# Patient Record
Sex: Female | Born: 1990 | Race: Black or African American | Hispanic: No | Marital: Single | Smoking: Former smoker
Health system: Southern US, Community
[De-identification: ages and names within clinical notes are randomized; demographics above are authoritative.]

## PROBLEM LIST (undated history)

## (undated) DIAGNOSIS — J45909 Unspecified asthma, uncomplicated: Secondary | ICD-10-CM

## (undated) DIAGNOSIS — T7840XA Allergy, unspecified, initial encounter: Secondary | ICD-10-CM

## (undated) HISTORY — DX: Allergy, unspecified, initial encounter: T78.40XA

## (undated) HISTORY — DX: Unspecified asthma, uncomplicated: J45.909

---

## 2013-04-15 ENCOUNTER — Ambulatory Visit (INDEPENDENT_AMBULATORY_CARE_PROVIDER_SITE_OTHER): Payer: BC Managed Care – PPO | Admitting: Physician Assistant

## 2013-04-15 VITALS — BP 104/60 | HR 82 | Temp 98.1°F | Resp 16 | Ht 66.75 in | Wt 146.2 lb

## 2013-04-15 DIAGNOSIS — J029 Acute pharyngitis, unspecified: Secondary | ICD-10-CM

## 2013-04-15 DIAGNOSIS — H9209 Otalgia, unspecified ear: Secondary | ICD-10-CM

## 2013-04-15 DIAGNOSIS — H9203 Otalgia, bilateral: Secondary | ICD-10-CM

## 2013-04-15 LAB — POCT RAPID STREP A (OFFICE): Rapid Strep A Screen: NEGATIVE

## 2013-04-15 MED ORDER — AMOXICILLIN 875 MG PO TABS: 875.0000 mg | ORAL_TABLET | Freq: Two times a day (BID) | ORAL | Status: AC

## 2013-04-15 MED ORDER — FIRST-DUKES MOUTHWASH MT SUSP: 5.0000 mL | OROMUCOSAL | Status: AC | PRN

## 2013-04-15 NOTE — Progress Notes (Signed)
  Subjective:    Patient ID: Rita Schmidt, female    DOB: [birth year], 22 y.o.   MRN: [id number]  HPI 22 year old female presents with 1 week history of intermittent sore throat.  States symptoms started with just a right sided sore throat with "white spots" but then improved.  States since then she has had intermittent symptoms and so she decided to come in for evaluation.  Also complains of bilateral ear pain and slight headache.  Does not have a hx of strep infections and has no known exposure to strep. Denies cough, SOB, wheezing, nausea, vomiting, dizziness, or abdominal pain.  Has been taking ibuprofen which does help temporarily.   Works at [employer] and [employer]. Also a Consulting civil engineer at [employer]    Review of Systems  Constitutional: Negative for fever and chills.  HENT: Positive for hearing loss and sore throat. Negative for congestion, rhinorrhea and postnasal drip.   Respiratory: Negative for cough, shortness of breath and wheezing.   Gastrointestinal: Negative for nausea, vomiting and abdominal pain.  Neurological: Positive for headaches. Negative for dizziness.       Objective:   Physical Exam  Constitutional: She is oriented to person, place, and time. She appears well-developed and well-nourished.  HENT:  Head: Normocephalic and atraumatic.  Right Ear: Hearing, tympanic membrane, external ear and ear canal normal.  Left Ear: Hearing, tympanic membrane, external ear and ear canal normal.  Mouth/Throat: Uvula is midline and mucous membranes are normal. Posterior oropharyngeal erythema (3+ tonsillar swelling) present. No oropharyngeal exudate, posterior oropharyngeal edema or tonsillar abscesses.  Eyes: Conjunctivae are normal.  Neck: Normal range of motion. Neck supple.  Cardiovascular: Normal rate, regular rhythm and normal heart sounds.   Pulmonary/Chest: Effort normal and breath sounds normal.  Lymphadenopathy:    She has cervical adenopathy (+AC).   Neurological: She is alert and oriented to person, place, and time.  Psychiatric: She has a normal mood and affect. Her behavior is normal. Judgment and thought content normal.          Assessment & Plan:  Acute pharyngitis - Plan: POCT rapid strep A, Culture, Group A Strep, amoxicillin (AMOXIL) 875 MG tablet, Diphenhyd-Hydrocort-Nystatin (FIRST-DUKES MOUTHWASH) SUSP  Will go ahead and treat with amoxicillin 875 mg bid x 10 days Throat culture sent May use Duke's Mouthwash q2-3hours prn pain Continue ibuprofen as needed Increase fluids and rest May be out of work tomorrow if needed. Otherwise ok to go back Follow up if symptoms worsen or fail to improve.

## 2013-04-17 LAB — CULTURE, GROUP A STREP: Organism ID, Bacteria: NORMAL

## 2014-03-16 ENCOUNTER — Emergency Department (HOSPITAL_BASED_OUTPATIENT_CLINIC_OR_DEPARTMENT_OTHER): Payer: BC Managed Care – PPO

## 2014-03-16 ENCOUNTER — Emergency Department (HOSPITAL_BASED_OUTPATIENT_CLINIC_OR_DEPARTMENT_OTHER)
Admission: EM | Admit: 2014-03-16 | Discharge: 2014-03-16 | Disposition: A | Discharge status: Home / Self Care | Payer: BC Managed Care – PPO | Attending: Emergency Medicine | Admitting: Emergency Medicine

## 2014-03-16 ENCOUNTER — Encounter (HOSPITAL_BASED_OUTPATIENT_CLINIC_OR_DEPARTMENT_OTHER): Payer: Self-pay | Admitting: Emergency Medicine

## 2014-03-16 DIAGNOSIS — Z3202 Encounter for pregnancy test, result negative: Secondary | ICD-10-CM | POA: Insufficient documentation

## 2014-03-16 DIAGNOSIS — J45909 Unspecified asthma, uncomplicated: Secondary | ICD-10-CM | POA: Insufficient documentation

## 2014-03-16 DIAGNOSIS — Z792 Long term (current) use of antibiotics: Secondary | ICD-10-CM | POA: Insufficient documentation

## 2014-03-16 DIAGNOSIS — M545 Low back pain, unspecified: Secondary | ICD-10-CM | POA: Insufficient documentation

## 2014-03-16 DIAGNOSIS — Z87891 Personal history of nicotine dependence: Secondary | ICD-10-CM | POA: Insufficient documentation

## 2014-03-16 LAB — URINALYSIS, ROUTINE W REFLEX MICROSCOPIC
BILIRUBIN URINE: NEGATIVE
GLUCOSE, UA: NEGATIVE mg/dL
Hgb urine dipstick: NEGATIVE
KETONES UR: NEGATIVE mg/dL
Nitrite: NEGATIVE
PH: 6.5 (ref 5.0–8.0)
PROTEIN: NEGATIVE mg/dL
Specific Gravity, Urine: 1.023 (ref 1.005–1.030)
Urobilinogen, UA: 0.2 mg/dL (ref 0.0–1.0)

## 2014-03-16 LAB — URINE MICROSCOPIC-ADD ON

## 2014-03-16 LAB — PREGNANCY, URINE: Preg Test, Ur: NEGATIVE

## 2014-03-16 MED ORDER — CYCLOBENZAPRINE HCL 10 MG PO TABS: 10.0000 mg | ORAL_TABLET | Freq: Three times a day (TID) | ORAL | Status: AC | PRN
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
## 2014-03-16 NOTE — Discharge Instructions (Signed)
Ibuprofen 600 mg every 6 hours as needed for pain.  Flexeril as prescribed as needed for pain not relieved with ibuprofen.  Return to the emergency department if you develop weakness in your legs, bowel or bladder incontinence, or any other new and concerning symptoms.   Back Pain, Adult Low back pain is very common. About 1 in 5 people have back pain.The cause of low back pain is rarely dangerous. The pain often gets better over time.About half of people with a sudden onset of back pain feel better in just 2 weeks. About 8 in 10 people feel better by 6 weeks.  CAUSES Some common causes of back pain include:  Strain of the muscles or ligaments supporting the spine.  Wear and tear (degeneration) of the spinal discs.  Arthritis.  Direct injury to the back. DIAGNOSIS Most of the time, the direct cause of low back pain is not known.However, back pain can be treated effectively even when the exact cause of the pain is unknown.Answering your caregiver's questions about your overall health and symptoms is one of the most accurate ways to make sure the cause of your pain is not dangerous. If your caregiver needs more information, he or she may order lab work or imaging tests (X-rays or MRIs).However, even if imaging tests show changes in your back, this usually does not require surgery. HOME CARE INSTRUCTIONS For many people, back pain returns.Since low back pain is rarely dangerous, it is often a condition that people can learn to [facility] their own.   Remain active. It is stressful on the back to sit or stand in one place. Do not sit, drive, or stand in one place for more than 30 minutes at a time. Take short walks on level surfaces as soon as pain allows.Try to increase the length of time you walk each day.  Do not stay in bed.Resting more than 1 or 2 days can delay your recovery.  Do not avoid exercise or work.Your body is made to move.It is not dangerous to be active, even though  your back may hurt.Your back will likely heal faster if you return to being active before your pain is gone.  Pay attention to your body when you bend and lift. Many people have less discomfortwhen lifting if they bend their knees, keep the load close to their bodies,and avoid twisting. Often, the most comfortable positions are those that put less stress on your recovering back.  Find a comfortable position to sleep. Use a firm mattress and lie on your side with your knees slightly bent. If you lie on your back, put a pillow under your knees.  Only take over-the-counter or prescription medicines as directed by your caregiver. Over-the-counter medicines to reduce pain and inflammation are often the most helpful.Your caregiver may prescribe muscle relaxant drugs.These medicines help dull your pain so you can more quickly return to your normal activities and healthy exercise.  Put ice on the injured area.  Put ice in a plastic bag.  Place a towel between your skin and the bag.  Leave the ice on for 15-20 minutes, 03-04 times a day for the first 2 to 3 days. After that, ice and heat may be alternated to reduce pain and spasms.  Ask your caregiver about trying back exercises and gentle massage. This may be of some benefit.  Avoid feeling anxious or stressed.Stress increases muscle tension and can worsen back pain.It is important to recognize when you are anxious or stressed and learn  ways to manage it.Exercise is a great option. SEEK MEDICAL CARE IF:  You have pain that is not relieved with rest or medicine.  You have pain that does not improve in 1 week.  You have new symptoms.  You are generally not feeling well. SEEK IMMEDIATE MEDICAL CARE IF:   You have pain that radiates from your back into your legs.  You develop new bowel or bladder control problems.  You have unusual weakness or numbness in your arms or legs.  You develop nausea or vomiting.  You develop abdominal  pain.  You feel faint. Document Released: 10/21/2005 Document Revised: 04/21/2012 Document Reviewed: 03/11/2011 [facility] Patient Information 2014 [place], [place].
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
## 2014-08-17 ENCOUNTER — Encounter (HOSPITAL_COMMUNITY): Payer: Self-pay | Admitting: Emergency Medicine

## 2014-08-17 ENCOUNTER — Emergency Department (INDEPENDENT_AMBULATORY_CARE_PROVIDER_SITE_OTHER)
Admission: EM | Admit: 2014-08-17 | Discharge: 2014-08-17 | Disposition: A | Discharge status: Home / Self Care | Payer: BC Managed Care – PPO | Source: Home / Self Care | Attending: Emergency Medicine | Admitting: Emergency Medicine

## 2014-08-17 DIAGNOSIS — R319 Hematuria, unspecified: Secondary | ICD-10-CM

## 2014-08-17 LAB — POCT URINALYSIS DIP (DEVICE)
BILIRUBIN URINE: NEGATIVE
GLUCOSE, UA: NEGATIVE mg/dL
KETONES UR: NEGATIVE mg/dL
Leukocytes, UA: NEGATIVE
NITRITE: NEGATIVE
PH: 6 (ref 5.0–8.0)
Protein, ur: >=300 mg/dL — AB
Specific Gravity, Urine: 1.02 (ref 1.005–1.030)
Urobilinogen, UA: 0.2 mg/dL (ref 0.0–1.0)

## 2014-08-17 LAB — POCT I-STAT, CHEM 8
BUN: 16 mg/dL (ref 6–23)
CHLORIDE: 102 meq/L (ref 96–112)
Calcium, Ion: 1.21 mmol/L (ref 1.12–1.23)
Creatinine, Ser: 0.6 mg/dL (ref 0.50–1.10)
Glucose, Bld: 104 mg/dL — ABNORMAL HIGH (ref 70–99)
HEMATOCRIT: 49 % — AB (ref 36.0–46.0)
HEMOGLOBIN: 16.7 g/dL — AB (ref 12.0–15.0)
POTASSIUM: 4 meq/L (ref 3.7–5.3)
SODIUM: 138 meq/L (ref 137–147)
TCO2: 25 mmol/L (ref 0–100)

## 2014-08-17 LAB — POCT PREGNANCY, URINE: Preg Test, Ur: NEGATIVE

## 2014-08-17 NOTE — Discharge Instructions (Signed)
I have spoken to the urologist on call with regard to your symptoms (Dr. Retta Dionesahlstedt @ [facility]) and he would like to see you in his office tomorrow. His office will give you all call this afternoon to schedule appointment for tomorrow. If you have not heard from his office by late this afternoon, please call office to schedule appointment.  Hematuria Hematuria is blood in your urine. It can be caused by a bladder infection, kidney infection, prostate infection, kidney stone, or cancer of your urinary tract. Infections can usually be treated with medicine, and a kidney stone usually will pass through your urine. If neither of these is the cause of your hematuria, further workup to find out the reason may be needed. It is very important that you tell your health care provider about any blood you see in your urine, even if the blood stops without treatment or happens without causing pain. Blood in your urine that happens and then stops and then happens again can be a symptom of a very serious condition. Also, pain is not a symptom in the initial stages of many urinary cancers. HOME CARE INSTRUCTIONS   Drink lots of fluid, 3-4 quarts a day. If you have been diagnosed with an infection, cranberry juice is especially recommended, in addition to large amounts of water.  Avoid caffeine, tea, and carbonated beverages because they tend to irritate the bladder.  Avoid alcohol because it may irritate the prostate.  Take all medicines as directed by your health care provider.  If you were prescribed an antibiotic medicine, finish it all even if you start to feel better.  If you have been diagnosed with a kidney stone, follow your health care provider's instructions regarding straining your urine to catch the stone.  Empty your bladder often. Avoid holding urine for long periods of time.  After a bowel movement, women should cleanse front to back. Use each tissue only once.  Empty your bladder before  and after sexual intercourse if you are a female. SEEK MEDICAL CARE IF:  You develop back pain.  You have a fever.  You have a feeling of sickness in your stomach (nausea) or vomiting.  Your symptoms are not better in 3 days. Return sooner if you are getting worse. SEEK IMMEDIATE MEDICAL CARE IF:   You develop severe vomiting and are unable to keep the medicine down.  You develop severe back or abdominal pain despite taking your medicines.  You begin passing a large amount of blood or clots in your urine.  You feel extremely weak or faint, or you pass out. MAKE SURE YOU:   Understand these instructions.  Will watch your condition.  Will get help right away if you are not doing well or get worse. Document Released: 10/21/2005 Document Revised: 03/07/2014 Document Reviewed: 06/21/2013 [facility] Patient Information 2015 [place]ExitCare, [place]LLC. This information is not intended to replace advice given to you by your health care provider. Make sure you discuss any questions you have with your health care provider.

## 2014-08-17 NOTE — ED Notes (Signed)
C/o noted blood in urine earlier today; NAD

## 2014-08-17 NOTE — ED Provider Notes (Signed)
CSN: [id number]     Arrival date & time 08/17/14  1431 History   First MD Initiated Contact with Patient 08/17/14 1547     Chief Complaint  Patient presents with  . 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  Patient is a 23 y.o. female presenting with hematuria. The history is provided by the patient.  Hematuria This is a new problem. The current episode started 6 to 12 hours ago. The problem has not changed since onset.   Past Medical History  Diagnosis Date  . Allergy   . Asthma    History reviewed. No pertinent past surgical history. History reviewed. No pertinent family history. History  Substance Use Topics  . Smoking status: Former Games developermoker  . Smokeless tobacco: Not on file  . Alcohol Use: No   OB History   Grav Para Term Preterm Abortions TAB SAB Ect Mult Living                 Review of Systems  Genitourinary: Positive for hematuria.  All other systems reviewed and are negative.   Allergies  Review of patient's allergies indicates no known allergies.  Home Medications   Prior to Admission medications   Medication Sig Start Date End Date Taking? Authorizing Provider  amoxicillin (AMOXIL) 875 MG tablet Take 1 tablet (875 mg total) by mouth 2 (two) times daily. 04/15/13   Heather Jaquita RectorM Marte, PA-C  cyclobenzaprine (FLEXERIL) 10 MG tablet Take 1 tablet (10 mg total) by mouth 3 (three) times daily as needed for muscle spasms. 03/16/14   Geoffery Lyonsouglas Delo, MD  Diphenhyd-Hydrocort-Nystatin (FIRST-DUKES MOUTHWASH) SUSP Use as directed 5-10 mLs in the mouth or throat every 2 (two) hours as needed. Use 1:1 ratio with viscous lidocaine 04/15/13   Heather M Marte, PA-C   BP 133/85  Pulse 109  Temp(Src) 99.2  F (37.3 C) (Oral)  Resp 16  SpO2 100% Physical Exam  Nursing note and vitals reviewed. Constitutional: She is oriented to person, place, and time. She appears well-developed and well-nourished. No distress.  HENT:  Head: Normocephalic and atraumatic.  Eyes: Conjunctivae are normal. No scleral icterus.  Cardiovascular: Normal rate, regular rhythm and normal heart sounds.   Pulmonary/Chest: Effort normal and breath sounds normal.  Abdominal: Soft. Normal appearance and bowel sounds are normal. She exhibits no distension and no mass. There is no tenderness. There is no CVA tenderness.  Musculoskeletal: Normal range of motion.  Neurological: She is alert and oriented to person, place, and time.  Skin: Skin is warm and dry. No rash noted. No erythema.  Psychiatric: She has a normal mood and affect. Her behavior is normal.    ED Course  Procedures (including critical care time) Labs Review Labs Reviewed  POCT URINALYSIS DIP (DEVICE) - Abnormal; Notable for the following:    Hgb urine dipstick LARGE (*)    Protein, ur >=300 (*)    All other components within normal limits  POCT I-STAT, CHEM 8 - Abnormal; Notable for the following:    Glucose, Bld 104 (*)    Hemoglobin 16.7 (*)    HCT 49.0 (*)    All other components within normal limits  POCT PREGNANCY, URINE    Imaging Review No results found.   MDM   1. Painless hematuria  UA without evidence of infection, only hematuria with associate proteinuria.  UPT negative.  Istat 8 unremarkable. Renal function normal. H/H 16/49. Concerned about origin of painless hematuria and contacted Dr. Retta Dionesahlstedt (Urologist on call) and he agrees with need for further investigation and would like to see patient in his office tomorrow. He advised that his office will contact patient for work-in appointment. I advised patient that if she has not heard from [facility] by end of the day today that she call at 8am tomorrow morning to schedule  appointment for 08/18/2014.  She is comfortable with outpatient follow up plan and voices understanding that if symptoms become suddenly worse or severe, or if she develop abdominal, back or flank pain or becomes unable to pass urine, she should report to her nearest emergency room for assistance.   Ria ClockJennifer Lee H Susi Goslin, [place] 08/17/14 [phone]

## 2014-08-19 NOTE — ED Provider Notes (Signed)
Medical screening examination/treatment/procedure(s) were performed by non-physician practitioner and as supervising physician I was immediately available for consultation/collaboration.  Leslee Homeavid Jabar Krysiak, M.D.  Reuben Likesavid C Trisha Ken, MD 08/19/14 2026
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
## 2017-04-02 ENCOUNTER — Other Ambulatory Visit: Payer: Self-pay | Admitting: Obstetrics and Gynecology

## 2017-04-02 ENCOUNTER — Other Ambulatory Visit (HOSPITAL_COMMUNITY)
Admission: RE | Admit: 2017-04-02 | Discharge: 2017-04-02 | Disposition: A | Discharge status: Home / Self Care | Payer: 59 | Source: Ambulatory Visit | Attending: Obstetrics and Gynecology | Admitting: Obstetrics and Gynecology

## 2017-04-02 DIAGNOSIS — Z01419 Encounter for gynecological examination (general) (routine) without abnormal findings: Secondary | ICD-10-CM | POA: Insufficient documentation

## 2017-04-02 DIAGNOSIS — Z1151 Encounter for screening for human papillomavirus (HPV): Secondary | ICD-10-CM | POA: Diagnosis present

## 2017-04-09 LAB — CYTOLOGY - PAP
Chlamydia: NEGATIVE
Diagnosis: NEGATIVE
HPV: NOT DETECTED
NEISSERIA GONORRHEA: NEGATIVE
